# Patient Record
Sex: Female | Born: 1990 | Race: Asian | Hispanic: No | Marital: Single | Smoking: Never smoker
Health system: Southern US, Community
[De-identification: ages and names within clinical notes are randomized; demographics above are authoritative.]

## PROBLEM LIST (undated history)

## (undated) HISTORY — PX: APPENDECTOMY: SHX54

---
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
## 2016-12-31 ENCOUNTER — Emergency Department (HOSPITAL_COMMUNITY)
Admission: EM | Admit: 2016-12-31 | Discharge: 2016-12-31 | Disposition: A | Discharge status: Home / Self Care | Payer: Worker's Compensation | Attending: Emergency Medicine | Admitting: Emergency Medicine

## 2016-12-31 ENCOUNTER — Emergency Department (HOSPITAL_COMMUNITY): Payer: Worker's Compensation

## 2016-12-31 ENCOUNTER — Encounter (HOSPITAL_COMMUNITY): Payer: Self-pay | Admitting: Emergency Medicine

## 2016-12-31 DIAGNOSIS — S0993XA Unspecified injury of face, initial encounter: Secondary | ICD-10-CM | POA: Diagnosis present

## 2016-12-31 DIAGNOSIS — W228XXA Striking against or struck by other objects, initial encounter: Secondary | ICD-10-CM | POA: Diagnosis not present

## 2016-12-31 DIAGNOSIS — S060X0A Concussion without loss of consciousness, initial encounter: Secondary | ICD-10-CM | POA: Diagnosis not present

## 2016-12-31 DIAGNOSIS — Y929 Unspecified place or not applicable: Secondary | ICD-10-CM | POA: Diagnosis not present

## 2016-12-31 DIAGNOSIS — Y939 Activity, unspecified: Secondary | ICD-10-CM | POA: Insufficient documentation

## 2016-12-31 DIAGNOSIS — Y990 Civilian activity done for income or pay: Secondary | ICD-10-CM | POA: Insufficient documentation
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
## 2022-06-02 ENCOUNTER — Encounter (HOSPITAL_COMMUNITY): Payer: Self-pay | Admitting: *Deleted

## 2022-06-02 ENCOUNTER — Ambulatory Visit (HOSPITAL_COMMUNITY)
Admission: EM | Admit: 2022-06-02 | Discharge: 2022-06-02 | Disposition: A | Discharge status: Home / Self Care | Payer: 59 | Attending: Emergency Medicine | Admitting: Emergency Medicine

## 2022-06-02 DIAGNOSIS — N3001 Acute cystitis with hematuria: Secondary | ICD-10-CM

## 2022-06-02 LAB — POCT URINALYSIS DIPSTICK, ED / UC
Bilirubin Urine: NEGATIVE
Glucose, UA: 500 mg/dL — AB
Ketones, ur: NEGATIVE mg/dL
Nitrite: NEGATIVE
Protein, ur: NEGATIVE mg/dL
Specific Gravity, Urine: 1.015 (ref 1.005–1.030)
Urobilinogen, UA: 1 mg/dL (ref 0.0–1.0)
pH: 7 (ref 5.0–8.0)

## 2022-06-02 MED ORDER — SULFAMETHOXAZOLE-TRIMETHOPRIM 800-160 MG PO TABS: 1.0000 | ORAL_TABLET | Freq: Two times a day (BID) | ORAL | 0 refills | Status: DC

## 2022-06-02 MED ORDER — SULFAMETHOXAZOLE-TRIMETHOPRIM 800-160 MG PO TABS: 1.0000 | ORAL_TABLET | Freq: Two times a day (BID) | ORAL | 0 refills | Status: AC

## 2022-06-02 NOTE — ED Triage Notes (Signed)
Pt reports starting with hematuria, dysuria, urinary urgency and polyuria yesterday. Denies fevers, abd or back pain.

## 2022-06-02 NOTE — ED Provider Notes (Signed)
[facility]    CSN: [id number] Arrival date & time: 06/02/22  1534     History   Chief Complaint Chief Complaint  Patient presents with   Hematuria    HPI Loretta Collins is a 31 y.o. female.  Presents with 1 day history of hematuria, dysuria, urgency Reports symptoms worsened today, felt she had to go the bathroom every hour Lots of bladder pain with urination  No history of UTI Denies fever, chills, abdominal pain, vomiting, flank pain  History reviewed. No pertinent past medical history.  There are no problems to display for this patient.   Past Surgical History:  Procedure Laterality Date   APPENDECTOMY      OB History   No obstetric history on file.      Home Medications    Prior to Admission medications   Medication Sig Start Date End Date Taking? Authorizing Provider  sulfamethoxazole-trimethoprim (BACTRIM DS) 800-160 MG tablet Take 1 tablet by mouth 2 (two) times daily for 3 days. 06/02/22 06/05/22  Travus Oren, Vernice Jefferson    Family History History reviewed. No pertinent family history.  Social History Social History   Tobacco Use   Smoking status: Never   Smokeless tobacco: Never  Vaping Use   Vaping Use: Every day  Substance Use Topics   Alcohol use: Yes    Comment: rarely   Drug use: Not Currently    Types: Marijuana    Comment: > 10 yrs ago     Allergies   Patient has no known allergies.   Review of Systems Review of Systems  Genitourinary:  Positive for hematuria.  Per HPI   Physical Exam Triage Vital Signs ED Triage Vitals [06/02/22 1633]  Enc Vitals Group     BP 113/68     Pulse Rate 68     Resp 16     Temp 98.8 F (37.1 C)     Temp Source Oral     SpO2 100 %     Weight      Height      Head Circumference      Peak Flow      Pain Score 0     Pain Loc      Pain Edu?      Excl. in [place]?    No data found.  Updated Vital Signs BP 113/68   Pulse 68   Temp 98.8 F (37.1 C) (Oral)   Resp 16   LMP  05/21/2022 (Approximate)   SpO2 100%    Physical Exam Vitals and nursing note reviewed.  Constitutional:      General: She is not in acute distress. HENT:     Mouth/Throat:     Mouth: Mucous membranes are moist.     Pharynx: Oropharynx is clear.  Eyes:     Conjunctiva/sclera: Conjunctivae normal.  Cardiovascular:     Rate and Rhythm: Normal rate and regular rhythm.     Heart sounds: Normal heart sounds.  Pulmonary:     Effort: Pulmonary effort is normal.     Breath sounds: Normal breath sounds.  Abdominal:     General: Bowel sounds are normal.     Palpations: Abdomen is soft.     Tenderness: There is no abdominal tenderness. There is no right CVA tenderness, left CVA tenderness or guarding.  Neurological:     Mental Status: She is alert and oriented to person, place, and time.     UC Treatments / Results  Labs (all  labs ordered are listed, but only abnormal results are displayed) Labs Reviewed  POCT URINALYSIS DIPSTICK, ED / UC - Abnormal; Notable for the following components:      Result Value   Glucose, UA 500 (*)    Hgb urine dipstick MODERATE (*)    Leukocytes,Ua TRACE (*)    All other components within normal limits  URINE CULTURE    EKG  Radiology No results found.  Procedures Procedures (including critical care time)  Medications Ordered in UC Medications - No data to display  Initial Impression / Assessment and Plan / UC Course  I have reviewed the triage vital signs and the nursing notes.  Pertinent labs & imaging results that were available during my care of the patient were reviewed by me and considered in my medical decision making (see chart for details).  Urinalysis with moderate hemoglobin, trace leuks.  Culture pending. Urine itself does not appear grossly infected but with patient's symptoms and discomfort, will treat with Bactrim twice daily for 3 days.  Discussed follow-up and return precautions.  Added to primary care assistance  list  Final Clinical Impressions(s) / UC Diagnoses   Final diagnoses:  Acute cystitis with hematuria     Discharge Instructions      Please take medication as prescribed. Take with food to avoid upset stomach.  Please return to the urgent care if symptoms persist despite medication.    ED Prescriptions     Medication Sig Dispense Auth. Provider   sulfamethoxazole-trimethoprim (BACTRIM DS) 800-160 MG tablet  (Status: Discontinued) Take 1 tablet by mouth 2 (two) times daily for 3 days. 6 tablet Glenetta Kiger, PA-C   sulfamethoxazole-trimethoprim (BACTRIM DS) 800-160 MG tablet Take 1 tablet by mouth 2 (two) times daily for 3 days. 6 tablet Margarett Viti, Lurena Joiner, PA-C      PDMP not reviewed this encounter.   Tiondra Fang, Ray Church 06/02/22 9937

## 2022-06-02 NOTE — Discharge Instructions (Addendum)
Please take medication as prescribed. Take with food to avoid upset stomach.  Please return to the urgent care if symptoms persist despite medication.

## 2022-06-03 LAB — URINE CULTURE

## 2022-06-04 LAB — URINE CULTURE: Culture: 60000 — AB
